# Patient Record
Sex: Male | Born: 1995 | Race: White | Hispanic: No
Health system: Southern US, Community
[De-identification: ages and names within clinical notes are randomized; demographics above are authoritative.]

---
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
## 2014-07-23 ENCOUNTER — Emergency Department: Payer: Self-pay | Admitting: Emergency Medicine

## 2014-11-18 ENCOUNTER — Emergency Department: Payer: Self-pay

## 2014-11-18 ENCOUNTER — Emergency Department
Admission: EM | Admit: 2014-11-18 | Discharge: 2014-11-18 | Disposition: A | Discharge status: Home / Self Care | Payer: Self-pay | Attending: Emergency Medicine | Admitting: Emergency Medicine

## 2014-11-18 ENCOUNTER — Encounter: Payer: Self-pay | Admitting: Emergency Medicine

## 2014-11-18 DIAGNOSIS — Y9389 Activity, other specified: Secondary | ICD-10-CM | POA: Insufficient documentation

## 2014-11-18 DIAGNOSIS — Y9289 Other specified places as the place of occurrence of the external cause: Secondary | ICD-10-CM | POA: Insufficient documentation

## 2014-11-18 DIAGNOSIS — Y998 Other external cause status: Secondary | ICD-10-CM | POA: Insufficient documentation

## 2014-11-18 DIAGNOSIS — S0101XA Laceration without foreign body of scalp, initial encounter: Secondary | ICD-10-CM | POA: Insufficient documentation

## 2014-11-18 MED ORDER — OXYCODONE-ACETAMINOPHEN 5-325 MG PO TABS: 1.0000 | ORAL_TABLET | Freq: Four times a day (QID) | ORAL | Status: DC | PRN

## 2014-11-18 MED ORDER — MUPIROCIN 2 % EX OINT: TOPICAL_OINTMENT | CUTANEOUS | Status: AC

## 2014-11-18 MED ORDER — OXYCODONE-ACETAMINOPHEN 5-325 MG PO TABS
2.0000 | ORAL_TABLET | Freq: Once | ORAL | Status: AC
  Administered 2014-11-18: 2 via ORAL

## 2014-11-18 MED ORDER — OXYCODONE-ACETAMINOPHEN 5-325 MG PO TABS
ORAL_TABLET | ORAL | Status: AC
  Administered 2014-11-18: 2 via ORAL
  Filled 2014-11-18: qty 2
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
## 2014-11-18 NOTE — Discharge Instructions (Signed)

## 2014-11-18 NOTE — ED Notes (Signed)
Family at bedside. 

## 2014-11-18 NOTE — ED Notes (Signed)
Pt c/o headache pain and increased pain when pt opens mouth.

## 2014-11-18 NOTE — ED Notes (Signed)
Vital signs stable. 

## 2014-11-18 NOTE — ED Notes (Signed)
Pt reporting dizziness with movement.

## 2014-11-29 ENCOUNTER — Emergency Department
Admission: EM | Admit: 2014-11-29 | Discharge: 2014-11-29 | Disposition: A | Discharge status: Home / Self Care | Payer: Self-pay | Attending: Emergency Medicine | Admitting: Emergency Medicine

## 2014-11-29 ENCOUNTER — Encounter: Payer: Self-pay | Admitting: *Deleted

## 2014-11-29 DIAGNOSIS — Z4802 Encounter for removal of sutures: Secondary | ICD-10-CM | POA: Insufficient documentation

## 2014-11-29 NOTE — Discharge Instructions (Signed)

## 2014-11-29 NOTE — ED Notes (Signed)
Pt presents w/ staples in L scalp that require removal.

## 2014-11-29 NOTE — ED Provider Notes (Signed)
[facility] Emergency Department Provider Note ____________________________________________  Time seen: 2035  I have reviewed the triage vital signs and the nursing notes.  HISTORY  Chief Complaint  Suture / Staple Removal  HPI  Joshua Robbins is a 19 y.o. male presents for staple removal. The wound is well healed without signs of infection.  The staples are removed. Wound care and activity instructions given. Return prn.  SUTURE REMOVAL Performed by: Lissa HoardMenshew, Haliey Romberg V Bacon  Consent: Verbal consent obtained. Patient identity confirmed: provided demographic data  Location details: left scalp  Wound Appearance: clean  Sutures/Staples Removed: #3 staples  Facility: sutures placed in this facility   Patient tolerance: Patient tolerated the procedure well with no immediate complications.  INITIAL IMPRESSION / ASSESSMENT AND PLAN / ED COURSE  Staple removal.   FINAL CLINICAL IMPRESSION(S) / ED DIAGNOSES  Final diagnoses:  Removal of staple    Lissa HoardJenise V Bacon Verlaine Embry, PA-C 11/29/14 2047  Darien Ramusavid W Kaminski, MD 11/29/14 [phone]

## 2015-07-07 ENCOUNTER — Emergency Department
Admission: EM | Admit: 2015-07-07 | Discharge: 2015-07-07 | Disposition: A | Discharge status: Home / Self Care | Payer: Self-pay | Attending: Student | Admitting: Student

## 2015-07-07 ENCOUNTER — Encounter: Payer: Self-pay | Admitting: Emergency Medicine

## 2015-07-07 DIAGNOSIS — S80861A Insect bite (nonvenomous), right lower leg, initial encounter: Secondary | ICD-10-CM | POA: Insufficient documentation

## 2015-07-07 DIAGNOSIS — L03115 Cellulitis of right lower limb: Secondary | ICD-10-CM | POA: Insufficient documentation

## 2015-07-07 DIAGNOSIS — Y9389 Activity, other specified: Secondary | ICD-10-CM | POA: Insufficient documentation

## 2015-07-07 DIAGNOSIS — Y998 Other external cause status: Secondary | ICD-10-CM | POA: Insufficient documentation

## 2015-07-07 DIAGNOSIS — Z792 Long term (current) use of antibiotics: Secondary | ICD-10-CM | POA: Insufficient documentation

## 2015-07-07 DIAGNOSIS — Y9289 Other specified places as the place of occurrence of the external cause: Secondary | ICD-10-CM | POA: Insufficient documentation

## 2015-07-07 DIAGNOSIS — W57XXXA Bitten or stung by nonvenomous insect and other nonvenomous arthropods, initial encounter: Secondary | ICD-10-CM | POA: Insufficient documentation

## 2015-07-07 MED ORDER — SULFAMETHOXAZOLE-TRIMETHOPRIM 800-160 MG PO TABS: 1.0000 | ORAL_TABLET | Freq: Two times a day (BID) | ORAL | Status: DC

## 2015-07-07 NOTE — ED Notes (Signed)
Possible insect bite to right lower leg 2 days ago  Area is now red and swollen

## 2015-07-07 NOTE — Discharge Instructions (Signed)

## 2015-07-07 NOTE — ED Provider Notes (Signed)
[facility] Emergency Department Provider Note  ____________________________________________  Time seen: Approximately 3:40 PM  I have reviewed the triage vital signs and the nursing notes.   HISTORY  Chief Complaint Insect Bite    HPI Joshua Robbins is a 20 y.o. male , NAD, presents to the emergency department with one-day history of insect bite to the right lower leg. Notes it began yesterday believes it to be a spider but did not see it occur.  Only has pain with weightbearing about the right, lateral lower leg. No fevers, chills, body aches. Denies abdominal pain, nausea, vomiting.   History reviewed. No pertinent past medical history.  There are no active problems to display for this patient.   History reviewed. No pertinent past surgical history.  Current Outpatient Rx  Name  Route  Sig  Dispense  Refill  . mupirocin ointment (BACTROBAN) 2 %      Apply to affected area 3 times daily   22 g   0   . mupirocin ointment (BACTROBAN) 2 %      Apply to affected area 3 times daily   22 g   0   . oxyCODONE-acetaminophen (ROXICET) 5-325 MG per tablet   Oral   Take 1 tablet by mouth every 6 (six) hours as needed.   12 tablet   0   . oxyCODONE-acetaminophen (ROXICET) 5-325 MG per tablet   Oral   Take 1 tablet by mouth every 6 (six) hours as needed.   12 tablet   0   . sulfamethoxazole-trimethoprim (BACTRIM DS,SEPTRA DS) 800-160 MG tablet   Oral   Take 1 tablet by mouth 2 (two) times daily.   20 tablet   0     Allergies Toradol; Tramadol; and Vicodin  No family history on file.  Social History Social History  Substance Use Topics  . Smoking status: Never Smoker   . Smokeless tobacco: Never Used  . Alcohol Use: No     Review of Systems  Constitutional: No fever/chills Cardiovascular: No chest pain. Respiratory:  No shortness of breath. No wheezing.  Gastrointestinal: No abdominal pain.  No nausea, vomiting.   Musculoskeletal:  Negative for myalgias Skin: Positive for skin lesion of right lower leg with surrounding redness and pain. Negative for rash, bruising. Neurological: Negative for headaches, focal weakness or numbness. 10-point ROS otherwise negative.  ____________________________________________   PHYSICAL EXAM:  VITAL SIGNS: ED Triage Vitals  Enc Vitals Group     BP 07/07/15 1520 122/86 mmHg     Pulse Rate 07/07/15 1520 101     Resp 07/07/15 1520 16     Temp 07/07/15 1520 97.8 F (36.6 C)     Temp Source 07/07/15 1520 Oral     SpO2 07/07/15 1520 96 %     Weight 07/07/15 1520 260 lb (117.935 kg)     Height 07/07/15 1520  (1.676 m)     Head Cir --      Peak Flow --      Pain Score 07/07/15 1520 7     Pain Loc --      Pain Edu? --      Excl. in GC? --     Constitutional: Alert and oriented. Well appearing and in no acute distress. Eyes: Conjunctivae are normal.  Head: Atraumatic. Neck: Supple with full range of motion Hematological/Lymphatic/Immunilogical: No cervical lymphadenopathy. Cardiovascular: Normal rate, regular rhythm. Normal S1 and S2.  Good peripheral circulation is bilateral lower extremity pulses palpated at  2+. Respiratory: Normal respiratory effort without tachypnea or retractions. Lungs CTAB. Musculoskeletal: No lower extremity tenderness nor edema.  No joint effusions. Neurologic:  Normal speech and language. No gross focal neurologic deficits are appreciated.  Skin:  Right lateral lower leg with one singular punctate lesion with 6 cm surrounding annular erythema and warmth. No oozing or weeping. No induration.  Psychiatric: Mood and affect are normal. Speech and behavior are normal. Patient exhibits appropriate insight and judgement.   ____________________________________________    LABS  None  ____________________________________________  EKG  None ____________________________________________  RADIOLOGY  None ____________________________________________    PROCEDURES  Procedure(s) performed: None    Medications - No data to display   ____________________________________________   INITIAL IMPRESSION / ASSESSMENT AND PLAN / ED COURSE  Patient's diagnosis is consistent with cellulitis right lower extremity. Patient will be discharged home with prescriptions for trimmed DS to take one tablet by mouth twice daily for 10 days. May take over-the-counter ibuprofen as needed for inflammation and pain as tolerated. Patient [facility] if symptoms persist past this treatment course. Patient is given ED precautions to return to the ED for any worsening or new symptoms.    ____________________________________________  FINAL CLINICAL IMPRESSION(S) / ED DIAGNOSES  Final diagnoses:  Cellulitis of right lower extremity  Insect bite      NEW MEDICATIONS STARTED DURING THIS VISIT:  New Prescriptions   SULFAMETHOXAZOLE-TRIMETHOPRIM (BACTRIM DS,SEPTRA DS) 800-160 MG TABLET    Take 1 tablet by mouth 2 (two) times daily.         Hope Pigeon, PA-C 07/07/15 1557  Gayla Doss, MD 07/08/15 [phone]

## 2015-07-07 NOTE — ED Notes (Signed)
Pt reports spider bite to right lower leg since yesterday, redness and warmth noted to area. Pt reports pain with walking.

## 2015-09-30 ENCOUNTER — Emergency Department
Admission: EM | Admit: 2015-09-30 | Discharge: 2015-09-30 | Disposition: A | Discharge status: Home / Self Care | Payer: Self-pay | Attending: Emergency Medicine | Admitting: Emergency Medicine

## 2015-09-30 ENCOUNTER — Encounter: Payer: Self-pay | Admitting: *Deleted

## 2015-09-30 DIAGNOSIS — L237 Allergic contact dermatitis due to plants, except food: Secondary | ICD-10-CM

## 2015-09-30 MED ORDER — TRIAMCINOLONE ACETONIDE 0.5 % EX OINT: 1.0000 "application " | TOPICAL_OINTMENT | Freq: Two times a day (BID) | CUTANEOUS | Status: DC

## 2015-09-30 MED ORDER — PREDNISONE 10 MG (21) PO TBPK: ORAL_TABLET | ORAL | Status: DC

## 2015-09-30 NOTE — ED Notes (Signed)
Pt states he got into some posion ivey at work on Friday and now has a breakout on his left leg, left arm and face

## 2015-09-30 NOTE — Discharge Instructions (Signed)
Poison [employer] Poison ivy is a inflammation of the skin (contact dermatitis) caused by touching the allergens on the leaves of the ivy plant following previous exposure to the plant. The rash usually appears 48 hours after exposure. The rash is usually bumps (papules) or blisters (vesicles) in a linear pattern. Depending on your own sensitivity, the rash may simply cause redness and itching, or it may also progress to blisters which may break open. These must be well cared for to prevent secondary bacterial (germ) infection, followed by scarring. Keep any open areas dry, clean, dressed, and covered with an antibacterial ointment if needed. The eyes may also get puffy. The puffiness is worst in the morning and gets better as the day progresses. This dermatitis usually heals without scarring, within 2 to 3 weeks without treatment. HOME CARE INSTRUCTIONS  Thoroughly wash with soap and water as soon as you have been exposed to poison ivy. You have about one half hour to remove the plant resin before it will cause the rash. This washing will destroy the oil or antigen on the skin that is causing, or will cause, the rash. Be sure to wash under your fingernails as any plant resin there will continue to spread the rash. Do not rub skin vigorously when washing affected area. Poison ivy cannot spread if no oil from the plant remains on your body. A rash that has progressed to weeping sores will not spread the rash unless you have not washed thoroughly. It is also important to wash any clothes you have been wearing as these may carry active allergens. The rash will return if you wear the unwashed clothing, even several days later. Avoidance of the plant in the future is the best measure. Poison ivy plant can be recognized by the number of leaves. Generally, poison ivy has three leaves with flowering branches on a single stem. Diphenhydramine may be purchased over the counter and used as needed for itching. Do not drive with  this medication if it makes you drowsy.Ask your caregiver about medication for children. SEEK MEDICAL CARE IF:  Open sores develop.  Redness spreads beyond area of rash.  You notice purulent (pus-like) discharge.  You have increased pain.  Other signs of infection develop (such as fever).   This information is not intended to replace advice given to you by your health care provider. Make sure you discuss any questions you have with your health care provider.   Document Released: 05/11/2000 Document Revised: 08/06/2011 Document Reviewed: 10/20/2014 Elsevier Interactive Patient Education 2016 Elsevier Inc.  Contact Dermatitis Dermatitis is redness, soreness, and swelling (inflammation) of the skin. Contact dermatitis is a reaction to certain substances that touch the skin. You either touched something that irritated your skin, or you have allergies to something you touched.  HOME CARE  Skin Care  Moisturize your skin as needed.  Apply cool compresses to the affected areas.   Try taking a bath with:   Epsom salts. Follow the instructions on the package. You can get these at a pharmacy or grocery store.   Baking soda. Pour a small amount into the bath as told by your doctor.   Colloidal oatmeal. Follow the instructions on the package. You can get this at a pharmacy or grocery store.   Try applying baking soda paste to your skin. Stir water into baking soda until it looks like paste.  Do not scratch your skin.   Bathe less often.  Bathe in lukewarm water. Avoid using hot water.  Medicines  Take or apply over-the-counter and prescription medicines only as told by your doctor.   If you were prescribed an antibiotic medicine, take or apply your antibiotic as told by your doctor. Do not stop taking the antibiotic even if your condition starts to get better. General Instructions  Keep all follow-up visits as told by your doctor. This is important.   Avoid the  substance that caused your reaction. If you do not know what caused it, keep a journal to try to track what caused it. Write down:   What you eat.   What cosmetic products you use.   What you drink.   What you wear in the affected area. This includes jewelry.   If you were given a bandage (dressing), take care of it as told by your doctor. This includes when to change and remove it.  GET HELP IF:   You do not get better with treatment.   Your condition gets worse.   You have signs of infection such as:  Swelling.  Tenderness.  Redness.  Soreness.  Warmth.   You have a fever.   You have new symptoms.  GET HELP RIGHT AWAY IF:   You have a very bad headache.  You have neck pain.  Your neck is stiff.   You throw up (vomit).   You feel very sleepy.   You see red streaks coming from the affected area.   Your bone or joint underneath the affected area becomes painful after the skin has healed.   The affected area turns darker.   You have trouble breathing.    This information is not intended to replace advice given to you by your health care provider. Make sure you discuss any questions you have with your health care provider.   Document Released: 03/11/2009 Document Revised: 02/02/2015 Document Reviewed: 09/29/2014 Elsevier Interactive Patient Education 2016 [employer].  Poison [employer] ivy is a inflammation of the skin (contact dermatitis) caused by touching the allergens on the leaves of the ivy plant following previous exposure to the plant. The rash usually appears 48 hours after exposure. The rash is usually bumps (papules) or blisters (vesicles) in a linear pattern. Depending on your own sensitivity, the rash may simply cause redness and itching, or it may also progress to blisters which may break open. These must be well cared for to prevent secondary bacterial (germ) infection, followed by scarring. Keep any open areas dry, clean,  dressed, and covered with an antibacterial ointment if needed. The eyes may also get puffy. The puffiness is worst in the morning and gets better as the day progresses. This dermatitis usually heals without scarring, within 2 to 3 weeks without treatment. HOME CARE INSTRUCTIONS  Thoroughly wash with soap and water as soon as you have been exposed to poison ivy. You have about one half hour to remove the plant resin before it will cause the rash. This washing will destroy the oil or antigen on the skin that is causing, or will cause, the rash. Be sure to wash under your fingernails as any plant resin there will continue to spread the rash. Do not rub skin vigorously when washing affected area. Poison ivy cannot spread if no oil from the plant remains on your body. A rash that has progressed to weeping sores will not spread the rash unless you have not washed thoroughly. It is also important to wash any clothes you have been wearing as these may carry active allergens. The  rash will return if you wear the unwashed clothing, even several days later. Avoidance of the plant in the future is the best measure. Poison ivy plant can be recognized by the number of leaves. Generally, poison ivy has three leaves with flowering branches on a single stem. Diphenhydramine may be purchased over the counter and used as needed for itching. Do not drive with this medication if it makes you drowsy.Ask your caregiver about medication for children. SEEK MEDICAL CARE IF:  Open sores develop.  Redness spreads beyond area of rash.  You notice purulent (pus-like) discharge.  You have increased pain.  Other signs of infection develop (such as fever).   This information is not intended to replace advice given to you by your health care provider. Make sure you discuss any questions you have with your health care provider.   Document Released: 05/11/2000 Document Revised: 08/06/2011 Document Reviewed: 10/20/2014 Elsevier  Interactive Patient Education [employer]

## 2015-09-30 NOTE — ED Provider Notes (Signed)
[facility] Emergency Department Provider Note ____________________________________________  Time seen: 1025am  I have reviewed the triage vital signs and the nursing notes.  HISTORY  Chief Complaint  Rash  HPI Joshua Robbins is a 20 y.o. male, NAD, presents to the emergency department today with a 6 day history of a poison ivy rash. He was building a fence while at work in a yard that was covered in [employer], [employer] ivy plants. He states he used an OTC cream as well as benedryl with little relief. He states that the rash is mostly on his left arm, left leg, and right side of his face. Denies fever, SOB, chest pain, or other injury.  History reviewed. No pertinent past medical history.  There are no active problems to display for this patient.   History reviewed. No pertinent past surgical history.  Current Outpatient Rx  Name  Route  Sig  Dispense  Refill  . mupirocin ointment (BACTROBAN) 2 %      Apply to affected area 3 times daily   22 g   0   . mupirocin ointment (BACTROBAN) 2 %      Apply to affected area 3 times daily   22 g   0   . oxyCODONE-acetaminophen (ROXICET) 5-325 MG per tablet   Oral   Take 1 tablet by mouth every 6 (six) hours as needed.   12 tablet   0   . oxyCODONE-acetaminophen (ROXICET) 5-325 MG per tablet   Oral   Take 1 tablet by mouth every 6 (six) hours as needed.   12 tablet   0   . predniSONE (STERAPRED UNI-PAK 21 TAB) 10 MG (21) TBPK tablet      6 day taper as directed   21 tablet   0   . sulfamethoxazole-trimethoprim (BACTRIM DS,SEPTRA DS) 800-160 MG tablet   Oral   Take 1 tablet by mouth 2 (two) times daily.   20 tablet   0   . triamcinolone ointment (KENALOG) 0.5 %   Topical   Apply 1 application topically 2 (two) times daily.   30 g   0    Allergies Toradol; Tramadol; and Vicodin  History reviewed. No pertinent family history.  Social History Social History  Substance Use Topics  . Smoking status:  Never Smoker   . Smokeless tobacco: Never Used  . Alcohol Use: No   Review of Systems  Constitutional: Negative for fever. Eyes: Negative for visual changes. ENT: Negative for sore throat. Skin: Positive for rash on left leg, arm, and right face. Neurological: Negative for headaches, focal weakness or numbness. ____________________________________________  PHYSICAL EXAM:  VITAL SIGNS: ED Triage Vitals  Enc Vitals Group     BP 09/30/15 0948 132/69 mmHg     Pulse Rate 09/30/15 0948 73     Resp 09/30/15 0948 18     Temp 09/30/15 0948 98.9 F (37.2 C)     Temp Source 09/30/15 0948 Oral     SpO2 09/30/15 0948 99 %     Weight 09/30/15 0948 260 lb (117.935 kg)     Height 09/30/15 0948 5\' 7"  (1.702 m)     Head Cir --      Peak Flow --      Pain Score 09/30/15 0949 6     Pain Loc --      Pain Edu? --      Excl. in GC? --    Constitutional: Alert and oriented. Well appearing and in no distress.  Head: Normocephalic and atraumatic. Hematological/Lymphatic/Immunological: No cervical lymphadenopathy. Respiratory: Normal respiratory effort. Musculoskeletal: Nontender with normal range of motion in all extremities.  Neurologic:  Normal gait without ataxia. Normal speech and language. No gross focal neurologic deficits are appreciated. Skin:  Skin is warm, dry and intact. Erythematous maculopapular rash noted on left lower leg, left forearm, and minimally to the right face. Blisters noted scattered over erythematous base.  ______________________________________________________________________  INITIAL IMPRESSION / ASSESSMENT AND PLAN / ED COURSE  Consistent with contact dermatitis from poison ivy. Will be discharged home with triamcinolone 0.5% ointment and a prednisone taper. He will follow with his primary care provider for ongoing symptom management. He may continue those over-the-counter Benadryl for antihistamine benefit. ____________________________________________  FINAL  CLINICAL IMPRESSION(S) / ED DIAGNOSES  Final diagnoses:  Poison ivy dermatitis     Lissa Hoard, PA-C 10/03/15 0013  Rockne Menghini, MD 10/10/15 2323
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
## 2016-01-15 ENCOUNTER — Emergency Department
Admission: EM | Admit: 2016-01-15 | Discharge: 2016-01-15 | Disposition: A | Discharge status: Home / Self Care | Payer: No Typology Code available for payment source | Attending: Emergency Medicine | Admitting: Emergency Medicine

## 2016-01-15 ENCOUNTER — Emergency Department: Payer: No Typology Code available for payment source

## 2016-01-15 ENCOUNTER — Encounter: Payer: Self-pay | Admitting: Emergency Medicine

## 2016-01-15 DIAGNOSIS — Y939 Activity, unspecified: Secondary | ICD-10-CM | POA: Diagnosis not present

## 2016-01-15 DIAGNOSIS — Y92410 Unspecified street and highway as the place of occurrence of the external cause: Secondary | ICD-10-CM | POA: Insufficient documentation

## 2016-01-15 DIAGNOSIS — Y999 Unspecified external cause status: Secondary | ICD-10-CM | POA: Diagnosis not present

## 2016-01-15 DIAGNOSIS — S9001XA Contusion of right ankle, initial encounter: Secondary | ICD-10-CM

## 2016-01-15 DIAGNOSIS — S99911A Unspecified injury of right ankle, initial encounter: Secondary | ICD-10-CM | POA: Diagnosis present

## 2016-01-15 MED ORDER — OXYCODONE-ACETAMINOPHEN 5-325 MG PO TABS: 1.0000 | ORAL_TABLET | ORAL | 0 refills | Status: AC | PRN
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
## 2016-01-15 NOTE — Discharge Instructions (Signed)
Ice and elevate her ankle as needed for swelling. Use Ace wrap for support. Take Percocet if needed for severe pain. You will need to [facility] if any continued problems.
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
## 2018-06-21 ENCOUNTER — Other Ambulatory Visit: Payer: Self-pay

## 2018-06-21 ENCOUNTER — Emergency Department
Admission: EM | Admit: 2018-06-21 | Discharge: 2018-06-22 | Disposition: A | Discharge status: Home / Self Care | Payer: Self-pay | Attending: Emergency Medicine | Admitting: Emergency Medicine

## 2018-06-21 DIAGNOSIS — Y999 Unspecified external cause status: Secondary | ICD-10-CM | POA: Insufficient documentation

## 2018-06-21 DIAGNOSIS — S161XXA Strain of muscle, fascia and tendon at neck level, initial encounter: Secondary | ICD-10-CM | POA: Insufficient documentation

## 2018-06-21 DIAGNOSIS — Y9389 Activity, other specified: Secondary | ICD-10-CM | POA: Insufficient documentation

## 2018-06-21 DIAGNOSIS — Y92410 Unspecified street and highway as the place of occurrence of the external cause: Secondary | ICD-10-CM | POA: Insufficient documentation

## 2018-06-21 DIAGNOSIS — M549 Dorsalgia, unspecified: Secondary | ICD-10-CM | POA: Insufficient documentation
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
## 2018-06-22 MED ORDER — CYCLOBENZAPRINE HCL 5 MG PO TABS: ORAL_TABLET | ORAL | 0 refills | Status: AC

## 2018-06-22 MED ORDER — CYCLOBENZAPRINE HCL 10 MG PO TABS
5.0000 mg | ORAL_TABLET | Freq: Once | ORAL | Status: AC
  Administered 2018-06-22: 5 mg via ORAL
  Filled 2018-06-22: qty 1

## 2018-06-22 NOTE — Discharge Instructions (Signed)
1.  Take muscle relaxer as needed (Flexeril #15). 2.  Apply moist heat to affected area several times daily. 3.  Return to the ER for worsening symptoms, persistent vomiting, difficulty breathing or other concerns.
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
## 2020-02-11 ENCOUNTER — Other Ambulatory Visit: Payer: Self-pay

## 2021-07-19 ENCOUNTER — Emergency Department
Admission: EM | Admit: 2021-07-19 | Discharge: 2021-07-19 | Disposition: A | Discharge status: Home / Self Care | Payer: Self-pay | Attending: Emergency Medicine | Admitting: Emergency Medicine

## 2021-07-19 ENCOUNTER — Other Ambulatory Visit: Payer: Self-pay

## 2021-07-19 DIAGNOSIS — Z0000 Encounter for general adult medical examination without abnormal findings: Secondary | ICD-10-CM | POA: Insufficient documentation

## 2021-07-19 DIAGNOSIS — Z130 Encounter for screening for diseases of the blood and blood-forming organs and certain disorders involving the immune mechanism: Secondary | ICD-10-CM | POA: Insufficient documentation

## 2021-07-19 LAB — RAPID HIV SCREEN (HIV 1/2 AB+AG)
HIV 1/2 Antibodies: NONREACTIVE
HIV-1 P24 Antigen - HIV24: NONREACTIVE

## 2021-07-19 LAB — HEPATITIS C ANTIBODY: HCV Ab: NONREACTIVE

## 2021-07-19 NOTE — Discharge Instructions (Signed)
Today, we tested for HIV, hepatitis C and hepatitis B.
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
## 2021-07-21 LAB — HEPATITIS B SURFACE ANTIBODY, QUANTITATIVE: Hepatitis B-Post: <3.1 m[IU]/mL — ABNORMAL LOW (ref >9.9)
